# Patient Record
Sex: Male | Born: 1955 | Race: White | Hispanic: No | Marital: Married | Smoking: Current every day smoker
Health system: Southern US, Community
[De-identification: ages and names within clinical notes are randomized; demographics above are authoritative.]

## PROBLEM LIST (undated history)

## (undated) DIAGNOSIS — F329 Major depressive disorder, single episode, unspecified: Secondary | ICD-10-CM

## (undated) DIAGNOSIS — F32A Depression, unspecified: Secondary | ICD-10-CM

## (undated) HISTORY — PX: HERNIA REPAIR: SHX51

## (undated) HISTORY — PX: BACK SURGERY: SHX140

## (undated) HISTORY — PX: APPENDECTOMY: SHX54

---

## 2015-05-31 ENCOUNTER — Encounter (HOSPITAL_BASED_OUTPATIENT_CLINIC_OR_DEPARTMENT_OTHER): Payer: Self-pay | Admitting: *Deleted

## 2015-05-31 DIAGNOSIS — M549 Dorsalgia, unspecified: Secondary | ICD-10-CM | POA: Diagnosis not present

## 2015-05-31 DIAGNOSIS — Y939 Activity, unspecified: Secondary | ICD-10-CM | POA: Diagnosis not present

## 2015-05-31 DIAGNOSIS — F329 Major depressive disorder, single episode, unspecified: Secondary | ICD-10-CM | POA: Diagnosis not present

## 2015-05-31 DIAGNOSIS — Z79899 Other long term (current) drug therapy: Secondary | ICD-10-CM | POA: Insufficient documentation

## 2015-05-31 DIAGNOSIS — F17210 Nicotine dependence, cigarettes, uncomplicated: Secondary | ICD-10-CM | POA: Insufficient documentation

## 2015-05-31 DIAGNOSIS — M542 Cervicalgia: Secondary | ICD-10-CM | POA: Diagnosis present

## 2015-05-31 DIAGNOSIS — Y999 Unspecified external cause status: Secondary | ICD-10-CM | POA: Diagnosis not present

## 2015-05-31 DIAGNOSIS — Y929 Unspecified place or not applicable: Secondary | ICD-10-CM | POA: Diagnosis not present

## 2015-05-31 DIAGNOSIS — R51 Headache: Secondary | ICD-10-CM | POA: Diagnosis not present
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
## 2015-06-01 ENCOUNTER — Encounter (HOSPITAL_BASED_OUTPATIENT_CLINIC_OR_DEPARTMENT_OTHER): Payer: Self-pay | Admitting: Emergency Medicine

## 2015-06-01 ENCOUNTER — Emergency Department (HOSPITAL_BASED_OUTPATIENT_CLINIC_OR_DEPARTMENT_OTHER)
Admission: EM | Admit: 2015-06-01 | Discharge: 2015-06-01 | Disposition: A | Discharge status: Home / Self Care | Payer: No Typology Code available for payment source | Attending: Emergency Medicine | Admitting: Emergency Medicine

## 2015-06-01 ENCOUNTER — Emergency Department (HOSPITAL_BASED_OUTPATIENT_CLINIC_OR_DEPARTMENT_OTHER): Payer: No Typology Code available for payment source

## 2015-06-01 DIAGNOSIS — T148XXA Other injury of unspecified body region, initial encounter: Secondary | ICD-10-CM

## 2015-06-01 HISTORY — DX: Major depressive disorder, single episode, unspecified: F32.9

## 2015-06-01 HISTORY — DX: Depression, unspecified: F32.A

## 2015-06-01 MED ORDER — NAPROXEN 500 MG PO TABS: 500.0000 mg | ORAL_TABLET | Freq: Two times a day (BID) | ORAL | Status: AC

## 2015-06-01 MED ORDER — METHOCARBAMOL 500 MG PO TABS: 500.0000 mg | ORAL_TABLET | Freq: Two times a day (BID) | ORAL | Status: AC

## 2015-06-01 MED ORDER — ACETAMINOPHEN 500 MG PO TABS
1000.0000 mg | ORAL_TABLET | Freq: Once | ORAL | Status: AC
  Administered 2015-06-01: 1000 mg via ORAL
  Filled 2015-06-01: qty 2

## 2015-06-01 MED ORDER — METHOCARBAMOL 500 MG PO TABS
1000.0000 mg | ORAL_TABLET | Freq: Once | ORAL | Status: AC
  Administered 2015-06-01: 1000 mg via ORAL
  Filled 2015-06-01: qty 2

## 2015-06-01 NOTE — Discharge Instructions (Signed)
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
## 2015-06-01 NOTE — ED Notes (Signed)
Patient transported to CT 
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
